# Patient Record
Sex: Female | Born: 1980 | Race: White | Hispanic: No | Marital: Married | Smoking: Never smoker
Health system: Southern US, Community
[De-identification: ages and names within clinical notes are randomized; demographics above are authoritative.]

## PROBLEM LIST (undated history)

## (undated) DIAGNOSIS — E611 Iron deficiency: Secondary | ICD-10-CM

## (undated) DIAGNOSIS — D649 Anemia, unspecified: Secondary | ICD-10-CM

## (undated) HISTORY — DX: Anemia, unspecified: D64.9

## (undated) HISTORY — DX: Iron deficiency: E61.1

---

## 2017-06-13 NOTE — Progress Notes (Signed)
[facility]  Telephone:([phone] [phone]) [phone]  Clinic New consult Note   Patient Care Team: Omer Jackouillard, Jennifer, PA-C as PCP - General (Physician Assistant) 06/16/2017   REFERRAL PHYSICIAN: Charlies Silversouillard, Jennifer, PA-C   CHIEF COMPLAINTS/PURPOSE OF CONSULTATION: Elevated Ferritin level   HISTORY OF PRESENTING ILLNESS:  Bethany Brown 37 y.o. female is here because of an elevated ferritin level. She was [facility]. She was seen on 05/22/17 with complaints of dysuria and heavy menstrual bleeding. Her dysuria was frequent and associated with urgency and hematuria. No fever, discharge, flank pain or vomiting. A UA, UC, CBC and CMP were ordered. Pt's urine tested positive for nitrates and negative for pregnancy. Her iron profile revealed that her Ferritin level was high at 346 and her saturation % was 51. She was diagnosed with a UTI and prescribed Cipro and instructed to take it for 10 days. She was also advised to follow up with GYN for her heavy menstrual bleeding. Her dysuria has resolved after the antibiotics. She notes she has loose stool or diarrhea 4-5 times a day.   Pt has not been taking an iron supplement but has taken one in the past (3 years ago). She states that she was anemic in the past and never had an elevated iron study. She notes that recently (in the past 6-12 months) she bruises easily, has increased fatigue, weight gain of 40-50 lbs, decreased appetite, insomnia and GERD symptoms. She reports she is able to function and complete daily tasks at home. She recently moved 6 months ago and has not started back at work. She notes that she is stressed and takes xanax for sleep every other day alternating with melatonin.   She has a FMHx of CAD, by her father and RA and DM by her mother. No personal or FMHx of blood disorders, CA or  hemochromatosis. Pt does not use tobacco and she drinks wine 4 times weekly. No illicit  drug use. No past surgeries.   On review of systems, pt denies joint pain. Pertinent positives are listed and detailed within the above HPI.    MEDICAL HISTORY:  Past Medical History:  Diagnosis Date  . Anemia   . Iron deficiency     SURGICAL HISTORY: None   SOCIAL HISTORY: Social History   Socioeconomic History  . Marital status: Married    Spouse name: Not on file  . Number of children: Not on file  . Years of education: Not on file  . Highest education level: Not on file  Social Needs  . Financial resource strain: Not on file  . Food insecurity - worry: Not on file  . Food insecurity - inability: Not on file  . Transportation needs - medical: Not on file  . Transportation needs - non-medical: Not on file  Occupational History  . Not on file  Tobacco Use  . Smoking status: Never Smoker  . Smokeless tobacco: Never Used  Substance and Sexual Activity  . Alcohol use: Yes    Alcohol/week: 0.6 - 1.2 oz    Types: 1 - 2 Glasses of wine per week    Comment: 4 days a week   . Drug use: Not on file  . Sexual activity: Not on file  Other Topics Concern  . Not on file  Social History Narrative  . Not on file    FAMILY HISTORY: Family History  Problem Relation Age of Onset  . Heart attack Father  ALLERGIES:  has no allergies on file.  MEDICATIONS:  Current Outpatient Medications  Medication Sig Dispense Refill  . Melatonin 3 MG CAPS Take 1 capsule by mouth at bedtime as needed.    . ALPRAZolam (XANAX) 1 MG tablet TAKE 1 TABLET BY MOUTH 3 TIMES A DAY AS NEEDED FOR SLEEP OR ANXIETY  5   No current facility-administered medications for this visit.     REVIEW OF SYSTEMS:   Constitutional: Denies fevers, chills or abnormal night sweats (+) fatigue, weight gain Eyes: Denies blurriness of vision, double vision or watery eyes Ears, nose, mouth, throat, and face: Denies mucositis or sore throat Respiratory: Denies cough, dyspnea or wheezes Cardiovascular: Denies  chest discomfort or lower extremity swelling Gastrointestinal:  Denies nausea,  (+) heartburn, change in bowel habits (+) diarrhea Skin: Denies abnormal skin rashes Lymphatics: Denies new lymphadenopathy (+) easy bruising Neurological:Denies numbness, tingling or new weaknesses Behavioral/Psych: Mood is stable, no new changes  All other systems were reviewed with the patient and are negative.  PHYSICAL EXAMINATION:  Vitals:   06/16/17 1444  BP: 125/82  Pulse: 98  Resp: 18  Temp: 98.2 F (36.8 C)  SpO2: 98%   Filed Weights   06/16/17 1444  Weight: 183 lb 6.4 oz (83.2 kg)    GENERAL:alert, no distress and comfortable, obese young female  SKIN: skin color, texture, turgor are normal, no rashes or significant lesions EYES: normal, conjunctiva are pink and non-injected, sclera clear OROPHARYNX:no exudate, no erythema and lips, buccal mucosa, and tongue normal  NECK: supple, thyroid normal size, non-tender, without nodularity LYMPH:  no palpable lymphadenopathy in the cervical, axillary or inguinal LUNGS: clear to auscultation and percussion with normal breathing effort HEART: regular rate & rhythm and no murmurs and no lower extremity edema ABDOMEN:abdomen soft, mild tenderness in the RUQ and in the mid abdomen. No rebound pain. No palpable organomegaly. No signs of ascites  Musculoskeletal:no cyanosis of digits and no clubbing  PSYCH: alert & oriented x 3 with fluent speech NEURO: no focal motor/sensory deficits  LABORATORY DATA:  I have reviewed her outside data as listed in H&P   RADIOGRAPHIC STUDIES: I have personally reviewed the radiological images as listed and agreed with the findings in the report. No results found.  ASSESSMENT & PLAN:  37 year old Caucasian female, without significant past medical history, except iron deficient anemia during pregnancy, presented with fatigue, weight gain, insomnia, mild abdominal pain and diarrhea.  Workup by her PCP reviewed  elevated ferritin and transferrin saturation.  1. Elevated Ferritin and transferrin saturation -I reviewed the pt's lab work with her in detail. On 05/22/17, her  Ferritin level was elevated at 346 and her transferrin saturation % was 51. I discussed the causes of an elevated ferritin including hemochromatosis, and infection or chronic inflammation. She had a UTI when that abnormal lab result was taken.  She does not appear to have chronic inflammation such as chronic infection or rheumatological disorder. - I discussed hemochromatosis is a lifelong genetic disease due to HFE gene mutation. Pt has stated that she use to be anemic 3 years ago so I do not think this is the likely cause of her elevated ferritin -I will repeat labs at a fasting condition in the morning on 06/17/17 or 06/18/17. I will call the next day with results. If the test results are still elevated, I will check the hemochromatosis gene mutation. -She will follow up with her PCP   2. Easy bruising  -She has had easy  bruising in the past several months, no significant past medical history of bleeding -Platelet counts normal -PT, APTT, and one Von willebrand panel  3. Fatigue, low appetite, weight gain and mild GI symptoms -not sure the etiology of her multiple complaints, I suggested her follow-up with her primary care physician, check thyroid function etc. -she has been on xanax for anxiety, she denies depression.  PLAN: -Lab on 1/15 or 1/16, including CBC, reticulocyte count, ferritin, iron study, PT, APTT, hemochromatosis gene mutation, and von Willebrand panel  -I will call with results -f/u based on above lab work results     All questions were answered. The patient knows to call the clinic with any problems, questions or concerns. I spent 35 minutes counseling the patient face to face. The total time spent in the appointment was 40 minutes and more than 50% was on counseling.    This document serves as a record of  services personally performed by Malachy Mood, MD. It was created on her behalf by Rosana Fret, a trained medical scribe. The creation of this record is based on the scribe's personal observations and the provider's statements to them.   I have reviewed the above documentation for accuracy and completeness, and I agree with the above.    Malachy Mood, MD 06/16/2017 8:10 PM

## 2017-06-16 ENCOUNTER — Telehealth: Payer: Self-pay | Admitting: Hematology

## 2017-06-16 ENCOUNTER — Encounter: Payer: Self-pay | Admitting: Hematology

## 2017-06-16 ENCOUNTER — Inpatient Hospital Stay: Payer: BLUE CROSS/BLUE SHIELD | Attending: Hematology | Admitting: Hematology

## 2017-06-16 DIAGNOSIS — E611 Iron deficiency: Secondary | ICD-10-CM

## 2017-06-16 DIAGNOSIS — Z79899 Other long term (current) drug therapy: Secondary | ICD-10-CM | POA: Insufficient documentation

## 2017-06-16 DIAGNOSIS — R5383 Other fatigue: Secondary | ICD-10-CM | POA: Insufficient documentation

## 2017-06-16 DIAGNOSIS — G4700 Insomnia, unspecified: Secondary | ICD-10-CM | POA: Diagnosis not present

## 2017-06-16 DIAGNOSIS — N390 Urinary tract infection, site not specified: Secondary | ICD-10-CM

## 2017-06-16 DIAGNOSIS — R630 Anorexia: Secondary | ICD-10-CM | POA: Insufficient documentation

## 2017-06-16 DIAGNOSIS — R197 Diarrhea, unspecified: Secondary | ICD-10-CM | POA: Diagnosis not present

## 2017-06-16 DIAGNOSIS — E119 Type 2 diabetes mellitus without complications: Secondary | ICD-10-CM | POA: Diagnosis not present

## 2017-06-16 DIAGNOSIS — K219 Gastro-esophageal reflux disease without esophagitis: Secondary | ICD-10-CM | POA: Diagnosis not present

## 2017-06-16 DIAGNOSIS — R109 Unspecified abdominal pain: Secondary | ICD-10-CM | POA: Diagnosis not present

## 2017-06-16 DIAGNOSIS — D649 Anemia, unspecified: Secondary | ICD-10-CM | POA: Insufficient documentation

## 2017-06-16 DIAGNOSIS — R7989 Other specified abnormal findings of blood chemistry: Secondary | ICD-10-CM | POA: Diagnosis present

## 2017-06-16 DIAGNOSIS — R233 Spontaneous ecchymoses: Secondary | ICD-10-CM

## 2017-06-16 DIAGNOSIS — R238 Other skin changes: Secondary | ICD-10-CM

## 2017-06-16 DIAGNOSIS — D509 Iron deficiency anemia, unspecified: Secondary | ICD-10-CM

## 2017-06-16 NOTE — Telephone Encounter (Signed)
Gave avs and calendar for January  °

## 2017-06-18 ENCOUNTER — Other Ambulatory Visit: Payer: BLUE CROSS/BLUE SHIELD

## 2017-06-20 ENCOUNTER — Telehealth: Payer: Self-pay | Admitting: *Deleted

## 2017-06-20 ENCOUNTER — Inpatient Hospital Stay: Payer: BLUE CROSS/BLUE SHIELD

## 2017-06-20 DIAGNOSIS — R7989 Other specified abnormal findings of blood chemistry: Secondary | ICD-10-CM | POA: Diagnosis not present

## 2017-06-20 DIAGNOSIS — R233 Spontaneous ecchymoses: Secondary | ICD-10-CM

## 2017-06-20 DIAGNOSIS — R238 Other skin changes: Secondary | ICD-10-CM

## 2017-06-20 LAB — IRON AND TIBC
IRON: 261 ug/dL — AB (ref 41–142)
SATURATION RATIOS: 90 % — AB (ref 21–57)
TIBC: 289 ug/dL (ref 236–444)
UIBC: 28 ug/dL

## 2017-06-20 LAB — CBC WITH DIFFERENTIAL (CANCER CENTER ONLY)
BASOS PCT: 1 %
Basophils Absolute: 0 10*3/uL (ref 0.0–0.1)
EOS ABS: 0.2 10*3/uL (ref 0.0–0.5)
EOS PCT: 4 %
HCT: 42.6 % (ref 34.8–46.6)
Hemoglobin: 14.9 g/dL (ref 11.6–15.9)
LYMPHS ABS: 1.3 10*3/uL (ref 0.9–3.3)
Lymphocytes Relative: 25 %
MCH: 34.3 pg — AB (ref 25.1–34.0)
MCHC: 35 g/dL (ref 31.5–36.0)
MCV: 97.9 fL (ref 79.5–101.0)
Monocytes Absolute: 0.4 10*3/uL (ref 0.1–0.9)
Monocytes Relative: 7 %
Neutro Abs: 3.4 10*3/uL (ref 1.5–6.5)
Neutrophils Relative %: 63 %
PLATELETS: 212 10*3/uL (ref 145–400)
RBC: 4.35 MIL/uL (ref 3.70–5.45)
RDW: 12.6 % (ref 11.2–16.1)
WBC: 5.3 10*3/uL (ref 3.9–10.3)

## 2017-06-20 LAB — APTT: APTT: 30 s (ref 24–36)

## 2017-06-20 LAB — FERRITIN: Ferritin: 468 ng/mL — ABNORMAL HIGH (ref 9–269)

## 2017-06-20 LAB — RETICULOCYTES
RBC.: 4.35 MIL/uL (ref 3.70–5.45)
RETIC COUNT ABSOLUTE: 56.6 10*3/uL (ref 33.7–90.7)
Retic Ct Pct: 1.3 % (ref 0.7–2.1)

## 2017-06-20 LAB — PROTIME-INR
INR: 1
Prothrombin Time: 13.1 seconds (ref 11.4–15.2)

## 2017-06-20 NOTE — Telephone Encounter (Signed)
Called pt & informed of elevated ferritin/iron per Dr Latanya MaudlinFeng's request but waiting on gene mutation test that will give us more information. Informed that it may take up to 2 wks to get results back.  She was very anxious & had lots of questions & asking why ferritin is elevated.  She had me call results to her husband & she set up a 3 way call so that I could tell him the same info.  She expressed understanding.

## 2017-06-20 NOTE — Telephone Encounter (Signed)
-----   Message from Malachy MoodYan Feng, MD sent at 06/20/2017  2:50 PM EST ----- Please call her today and let her know that her iron level is still high, and her gene mutation test result is pending, will take up to 2 weeks, will call her when result is back. She is anxious to know the result.   Thanks  Malachy MoodYan Feng  06/20/2017

## 2017-06-25 LAB — VON WILLEBRAND PANEL
Coagulation Factor VIII: 99 % (ref 57–163)
Ristocetin Co-factor, Plasma: 98 % (ref 50–200)
Von Willebrand Antigen, Plasma: 97 % (ref 50–200)

## 2017-06-25 LAB — HEMOCHROMATOSIS DNA-PCR(C282Y,H63D)

## 2017-06-25 LAB — COAG STUDIES INTERP REPORT

## 2017-07-04 ENCOUNTER — Telehealth: Payer: Self-pay | Admitting: Hematology

## 2017-07-04 NOTE — Telephone Encounter (Signed)
I called patient to discuss her lab results. 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 Since she has elevated iron level, I will schedule her f/u appointment in 1-2 weeks to discuss further tests such as MRI of liver and treatment. I will send a scheduling message.  Malachy MoodYan Annet Manukyan  07/04/2017

## 2017-07-07 ENCOUNTER — Telehealth: Payer: Self-pay | Admitting: Hematology

## 2017-07-07 NOTE — Telephone Encounter (Signed)
Spoke with patient regarding appointment per sched msg 2/1 from YF

## 2017-07-15 ENCOUNTER — Ambulatory Visit: Payer: BLUE CROSS/BLUE SHIELD | Admitting: Nurse Practitioner

## 2017-07-15 ENCOUNTER — Telehealth: Payer: Self-pay | Admitting: Hematology

## 2017-07-15 ENCOUNTER — Other Ambulatory Visit: Payer: Self-pay | Admitting: Hematology

## 2017-07-15 NOTE — Telephone Encounter (Signed)
I called patient today, and explained her lab results in great details.  I recommend her to have a liver MRI to see if she has evidence of iron overload in her liver.  I will cancel her appointment tomorrow, and rescheduled after her MRI.  Patient is agreeable with the plan.  Malachy MoodYan Xara Paulding  07/15/2017

## 2017-07-16 ENCOUNTER — Ambulatory Visit: Payer: BLUE CROSS/BLUE SHIELD | Admitting: Hematology

## 2017-07-22 ENCOUNTER — Telehealth: Payer: Self-pay | Admitting: *Deleted

## 2017-07-22 ENCOUNTER — Telehealth: Payer: Self-pay | Admitting: Hematology

## 2017-07-22 ENCOUNTER — Ambulatory Visit (HOSPITAL_COMMUNITY)
Admission: RE | Admit: 2017-07-22 | Discharge: 2017-07-22 | Disposition: A | Discharge status: Home / Self Care | Payer: BLUE CROSS/BLUE SHIELD | Source: Ambulatory Visit | Attending: Hematology | Admitting: Hematology

## 2017-07-22 ENCOUNTER — Encounter (HOSPITAL_COMMUNITY): Payer: Self-pay | Admitting: Radiology

## 2017-07-22 DIAGNOSIS — K760 Fatty (change of) liver, not elsewhere classified: Secondary | ICD-10-CM | POA: Insufficient documentation

## 2017-07-22 DIAGNOSIS — K7689 Other specified diseases of liver: Secondary | ICD-10-CM | POA: Diagnosis not present

## 2017-07-22 NOTE — Telephone Encounter (Signed)
I called pt back and left a message on her phone: liver MRI shows fatty liver, but no evidence of iron overload. I think phlebotomy is still beneficial for her, and will schedule her f/u and phlebotomy next week. Will discuss more when I see her next week.   Malachy MoodYan Jovonta Levit MD

## 2017-07-22 NOTE — Telephone Encounter (Signed)
Pt in lobby, asking to speak to Dr. Mosetta PuttFeng regarding results of her MRI done earlier this am.  Spoke with patient and informed her Dr. Mosetta PuttFeng was not in the office today. Pt asking for results of her MRI. Advised pt that the results were not available yet and that Dr. Mosetta PuttFeng would call her with results sometime this week.  Pt currently does not have an appointment set up with Dr. Mosetta PuttFeng.  Pt voiced understanding.

## 2017-07-22 NOTE — Telephone Encounter (Signed)
Left message for patient regarding appointment per 2/19 sch msg

## 2017-07-28 ENCOUNTER — Telehealth: Payer: Self-pay

## 2017-07-28 ENCOUNTER — Encounter: Payer: Self-pay | Admitting: *Deleted

## 2017-07-28 ENCOUNTER — Telehealth: Payer: Self-pay | Admitting: *Deleted

## 2017-07-28 NOTE — Telephone Encounter (Signed)
1450-Patient called and was very worried about her phlebotomy appointment.  She is new to the area and wanted to know appointment time and what to expect.  I informed her that she would be here for about 1.5-2 hours total and she would get an IV and they would draw back her blood.  She is worrried since she has never had this done before.  I also informed her that she could have one visitor with her and no children with her due to our flu policy right now.  Although she is worried, she understands and will be here tomorrow. Lorayne MarekMelanie M Rodgers RN, BSN, CPN

## 2017-07-28 NOTE — Telephone Encounter (Signed)
Received vm call from pt asking for return call to give her information regarding what to expect tomorrow with phlebotomy.  She wants to know if she can drive & how long will it take.   Call returned but line busy x 1 Pt called back & discussed what to expect.  Informed to drink plenty of fluids before & after phlebotomy & to eat well before coming.  She is concerned about her son since her husband is out of town & may not get back in time to pick him up. She request a letter to her son's school ([employer]) allowing him to ride the school bus 75 to neighbor's home.  ([place])  Tomorrow.  Fax # [phone].  Informed that this would be sent.

## 2017-07-29 ENCOUNTER — Encounter: Payer: Self-pay | Admitting: Nurse Practitioner

## 2017-07-29 ENCOUNTER — Ambulatory Visit: Payer: BLUE CROSS/BLUE SHIELD

## 2017-07-29 ENCOUNTER — Inpatient Hospital Stay: Payer: BLUE CROSS/BLUE SHIELD | Attending: Hematology | Admitting: Nurse Practitioner

## 2017-07-29 ENCOUNTER — Ambulatory Visit (HOSPITAL_BASED_OUTPATIENT_CLINIC_OR_DEPARTMENT_OTHER): Payer: BLUE CROSS/BLUE SHIELD | Admitting: Medical

## 2017-07-29 VITALS — BP 122/99 | HR 99 | Temp 98.3°F | Resp 18 | Ht 62.5 in | Wt 187.9 lb

## 2017-07-29 DIAGNOSIS — R630 Anorexia: Secondary | ICD-10-CM | POA: Diagnosis not present

## 2017-07-29 DIAGNOSIS — R7989 Other specified abnormal findings of blood chemistry: Secondary | ICD-10-CM

## 2017-07-29 DIAGNOSIS — R5383 Other fatigue: Secondary | ICD-10-CM | POA: Diagnosis not present

## 2017-07-29 DIAGNOSIS — F419 Anxiety disorder, unspecified: Secondary | ICD-10-CM | POA: Diagnosis not present

## 2017-07-29 DIAGNOSIS — R110 Nausea: Secondary | ICD-10-CM

## 2017-07-29 DIAGNOSIS — R55 Syncope and collapse: Secondary | ICD-10-CM

## 2017-07-29 MED ORDER — PROMETHAZINE HCL 25 MG PO TABS: 25.0000 mg | ORAL_TABLET | Freq: Three times a day (TID) | ORAL | 0 refills | Status: AC | PRN

## 2017-07-29 MED ORDER — IBUPROFEN 200 MG PO TABS
ORAL_TABLET | ORAL | Status: AC
  Filled 2017-07-29: qty 2

## 2017-07-29 MED ORDER — IBUPROFEN 200 MG PO TABS
400.0000 mg | ORAL_TABLET | Freq: Once | ORAL | Status: AC
  Administered 2017-07-29: 400 mg via ORAL

## 2017-07-29 MED ORDER — FAMOTIDINE IN NACL 20-0.9 MG/50ML-% IV SOLN
20.0000 mg | INTRAVENOUS | Status: DC
  Administered 2017-07-29: 20 mg via INTRAVENOUS

## 2017-07-29 MED ORDER — ONDANSETRON HCL 4 MG/2ML IJ SOLN
8.0000 mg | INTRAMUSCULAR | Status: AC
  Administered 2017-07-29: 8 mg via INTRAVENOUS

## 2017-07-29 MED ORDER — SODIUM CHLORIDE 0.9 % IV SOLN
INTRAVENOUS | Status: DC
Start: 2017-07-29 — End: 2017-07-29
  Administered 2017-07-29: 12:00:00 via INTRAVENOUS

## 2017-07-29 MED ORDER — SODIUM CHLORIDE 0.9 % IV SOLN
20.0000 mg | Freq: Once | INTRAVENOUS | Status: AC
  Filled 2017-07-29: qty 2

## 2017-07-29 MED ORDER — ONDANSETRON HCL 4 MG/2ML IJ SOLN
INTRAMUSCULAR | Status: AC
  Filled 2017-07-29: qty 4

## 2017-07-29 NOTE — Patient Instructions (Signed)

## 2017-07-29 NOTE — Progress Notes (Addendum)
[facility]  Telephone:(336) [phone] Fax:(336) [phone]  Clinic Follow up Note   Patient Care Team: Omer Jack as PCP - General (Physician Assistant) 07/29/2017  CHIEF COMPLAINT: Elevated ferritin and transferrin saturation  CURRENT THERAPY: Therapeutic phlebotomy as needed  INTERVAL HISTORY: Ms. Bethany Brown returns for follow-up as scheduled prior to first phlebotomy.  She is extremely anxious today, she got no sleep last night despite taking Xanax and melatonin due to stress and anxiety.  She continues to report fatigue and low energy.  She has not been able to eat today due to her nerves, reporting a headache.  She has periodic diarrhea, increased today which she feels is related to anxiety. She notes unintentional weight gain.  Skin is occasionally warm and flushed without fever or chills.   REVIEW OF SYSTEMS:   Constitutional: Denies fevers, chills or abnormal weight loss (+) fatigue (+) unintentional weight gain  Eyes: Denies blurriness of vision Ears, nose, mouth, throat, and face: Denies mucositis or sore throat Respiratory: Denies cough, dyspnea or wheezes Cardiovascular: Denies palpitation, chest discomfort or lower extremity swelling Gastrointestinal:  Denies vomiting, constipation, heartburn or change in bowel habits (+) nausea (+) periodic diarrhea, increased today due to anxiety  Skin: Denies abnormal skin rashes (+) occasionally warm and flushed  Lymphatics: Denies new lymphadenopathy (+) easy bruising Neurological:Denies numbness, tingling or new weaknesses (+) headache Behavioral/Psych: Mood is stable, no new changes (+) anxiety (+) tearful  All other systems were reviewed with the patient and are negative.  MEDICAL HISTORY:  Past Medical History:  Diagnosis Date  . Anemia   . Iron deficiency     SURGICAL HISTORY: No past surgical history on file.  I have reviewed the social history and family history with the patient and they are  unchanged from previous note.  ALLERGIES:  has no allergies on file.  MEDICATIONS:  Current Outpatient Medications  Medication Sig Dispense Refill  . ALPRAZolam (XANAX) 1 MG tablet TAKE 1 TABLET BY MOUTH 3 TIMES A DAY AS NEEDED FOR SLEEP OR ANXIETY  5  . Melatonin 3 MG CAPS Take 1 capsule by mouth at bedtime as needed.     No current facility-administered medications for this visit.     PHYSICAL EXAMINATION: ECOG PERFORMANCE STATUS: 1 - Symptomatic but completely ambulatory  Vitals:   07/29/17 1006  BP: (!) 122/99  Pulse: 99  Resp: 18  Temp: 98.3 F (36.8 C)  SpO2: 98%   Filed Weights   07/29/17 1006  Weight: 187 lb 14.4 oz (85.2 kg)    GENERAL:alert, no distress and comfortable SKIN: skin color, texture, turgor are normal, no rashes or significant lesions EYES: normal, Conjunctiva are pink and non-injected, sclera clear OROPHARYNX:no exudate, no erythema and lips, buccal mucosa, and tongue normal  NECK: supple, thyroid normal size, non-tender, without nodularity LYMPH:  no palpable cervical or supraclavicular lymphadenopathy  LUNGS: clear to auscultation bilaterally with normal breathing effort HEART: regular rate & rhythm and no murmurs and no lower extremity edema ABDOMEN:abdomen soft, non-tender and normal bowel sounds. No hepatomegaly  Musculoskeletal:no cyanosis of digits and no clubbing  NEURO: alert & oriented x 3 with fluent speech, no focal motor/sensory deficits (+) Anxious  LABORATORY DATA:  I have reviewed the data as listed CBC Latest Ref Rng & Units 06/20/2017  WBC 3.9 - 10.3 K/uL 5.3  Hematocrit 34.8 - 46.6 % 42.6  Platelets 145 - 400 K/uL 212    RADIOGRAPHIC STUDIES: I have personally reviewed the radiological images as  listed and agreed with the findings in the report. No results found.   MRI ABDOMEN WO CONTRAST IMPRESSION: 07/22/17 1. Hepatic steatosis. No findings to suggest iron deposition within the liver. 2. Small liver  cysts.  ASSESSMENT & PLAN: 37 year old Caucasian female, without significant past medical history, except iron deficient anemia during pregnancy, presented with fatigue, weight gain, insomnia, mild abdominal pain and diarrhea.  Workup by her PCP reviewed elevated ferritin and transferrin saturation.  1. 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 MR abdomen showed hepatic steatosis but no findings to suggest iron deposition within the liver. Dr. Mosetta PuttFeng has reviewed her labs and findings in great detail. She recommends therapeutic phlebotomy today and in the future as needed. Will recheck fasting labs in 2 months and determine plan after those results. F/u with Dr. Mosetta PuttFeng in 4 months.  2. Easy bruising Platelets, PT/APTT, and von willebrand panel are are normal.  3. Fatigue, low appetite, weight gain, mild GI symptoms -I encouraged her to eat well rounded diet and be active. Her PCP is moving out of state but there is an interim provider covering at the practice. I encouraged her to f/u with PCP for these symptoms. During today's visit she requested anti-emetic, I prescribed phenergan to her pharmacy.  4. Anxiety She is very anxious about phlebotomy today. I explained the procedure and monitoring in detail. Her husband is out of town for work, an 35 year old son at home, and is buying a house out of state, all contributing to her stress. Takes xanax as needed.   PLAN: -Therapeutic phlebotomy today -Fasting labs in 2 months -Lab, f/u with Dr. Mosetta PuttFeng in 4 months -Prescription for phenergan   All questions were answered. The patient knows to call the clinic with any problems, questions or concerns. No barriers to learning was detected.     [name], NP 07/29/17

## 2017-07-29 NOTE — Addendum Note (Signed)
Addended by: Pollyann SamplesBURTON, LACIE K on: 07/29/2017 02:21 PM   Modules accepted: Orders

## 2017-07-29 NOTE — Progress Notes (Signed)
Bethany PiqueChristie Brown presents today for phlebotomy per MD orders. Phlebotomy procedure started at 1135 and ended at 1201. 18G needle in Left AC used to remove 478 grams. Needle removed, intact.   1205 Patient stated she was getting very hot, sweaty and nauseated. Vitals taken 65/39  Zenaida NieceVan, [place] called  2L saline started at 1214 vs 97/79 Pepcid at 1216 2L O2 started Zofran 1220 96/69  New iv started in left hand. See flowsheet  Patient will continue to be monitored.  Patient tolerated procedure well. IV needle removed intact.  Patient observed for about 2 hours post phlebotomy. VS stable, patient reports she is feeling better.

## 2017-07-31 NOTE — Progress Notes (Signed)
[facility] Progress Note   Bethany PiqueChristie Merritts [id number] 10/17/1980 37 y.o.  Bethany Brown is managed by Dr. Vicki MalletYan Feng  Jakeria Ney presents for a therapeutic phlebotomy.   Assessment: Plan:    Vagal reaction  Bethany Brown was seen in the infusion room for a suspected vagal reaction. She completed a therapeutic phlebotomy prior to her reaction. Her symptoms included: Weakness, hypotension, and nausea. Bethany Brown was given normal saline IV, Zofran IV, Pepcid IV, and supplemental oxygenafter onset of her symptoms. Bethany Brown did not completely respond to intervention.  She had to receive a second liter of normal saline and call for family/friends to pick her up.   Please see After Visit Summary for patient specific instructions.  Future Appointments  Date Time Provider Department Center  09/26/2017  8:00 AM [facility] ONLY [facility] None  11/26/2017  9:45 AM [facility] LAB 6 [facility] None  11/26/2017 10:15 AM Malachy MoodFeng, Yan, MD [facility] None    No orders of the defined types were placed in this encounter.      Subjective:   Patient ID:  Bethany PiqueChristie Krigbaum is a 37 y.o. (DOB 12/17/1980) female.  Chief Complaint: No chief complaint on file.   HPI Bethany Brown was seen in the infusion room for a suspected vagal reaction. She completed a therapeutic phlebotomy prior to her reaction. Her symptoms included: Weakness, hypotension, and nausea.  Bethany Brown was given normal saline IV, Zofran IV, Pepcid IV, and supplemental oxygen after onset of her symptoms. Bethany Brown did not completely respond to intervention.  She had to receive a second liter of normal saline and call for family/friends to pick her up.   Medications: I have reviewed the patient's current medications.  Allergies: Not on File  Past Medical History:  Diagnosis Date  . Anemia   . Iron deficiency     No past surgical history on file.  Family History  Problem  Relation Age of Onset  . Heart attack Father     Social History   Socioeconomic History  . Marital status: Married    Spouse name: Not on file  . Number of children: Not on file  . Years of education: Not on file  . Highest education level: Not on file  Social Needs  . Financial resource strain: Not on file  . Food insecurity - worry: Not on file  . Food insecurity - inability: Not on file  . Transportation needs - medical: Not on file  . Transportation needs - non-medical: Not on file  Occupational History  . Not on file  Tobacco Use  . Smoking status: Never Smoker  . Smokeless tobacco: Never Used  Substance and Sexual Activity  . Alcohol use: Yes    Alcohol/week: 0.6 - 1.2 oz    Types: 1 - 2 Glasses of wine per week    Comment: 4 days a week   . Drug use: Not on file  . Sexual activity: Not on file  Other Topics Concern  . Not on file  Social History Narrative  . Not on file    Past Medical History, Surgical history, Social history, and Family history were reviewed and updated as appropriate.   Please see review of systems for further details on the patient's review from today.   Review of Systems:  Review of Systems  HENT: Negative for trouble swallowing.   Respiratory: Negative for cough, choking, shortness of breath and wheezing.   Gastrointestinal: Positive for nausea. Negative for vomiting.  Neurological: Positive for weakness. Negative for headaches.    Objective:   Physical Exam:  There were no vitals taken for this visit.  Physical Exam  Constitutional: No distress.  HENT:  Head: Normocephalic and atraumatic.  Cardiovascular: Normal rate, regular rhythm and normal heart sounds. Exam reveals no gallop and no friction rub.  No murmur heard. Pulmonary/Chest: Effort normal and breath sounds normal. No respiratory distress. She has no wheezes. She has no rales.  Neurological: She is alert.  Skin: Skin is warm and dry. No rash noted. She is not  diaphoretic. No erythema.    Lab Review:  No results found for: NA, K, CL, CO2, GLUCOSE, BUN, CREATININE, CALCIUM, PROT, ALBUMIN, AST, ALT, ALKPHOS, BILITOT, GFRNONAA, GFRAA     Component Value Date/Time   WBC 5.3 06/20/2017 1115   RBC 4.35 06/20/2017 1115   RBC 4.35 06/20/2017 1115   HCT 42.6 06/20/2017 1115   PLT 212 06/20/2017 1115   MCV 97.9 06/20/2017 1115   MCH 34.3 (H) 06/20/2017 1115   MCHC 35.0 06/20/2017 1115   RDW 12.6 06/20/2017 1115   LYMPHSABS 1.3 06/20/2017 1115   MONOABS 0.4 06/20/2017 1115   EOSABS 0.2 06/20/2017 1115   BASOSABS 0.0 06/20/2017 1115   -------------------------------  Imaging from last 24 hours (if applicable):  Radiology interpretation: Mr Abdomen Wo Contrast  Result Date: 07/22/2017 CLINICAL DATA:  Evaluate for possible hemochromatosis. EXAM: MRI ABDOMEN WITHOUT CONTRAST TECHNIQUE: Multiplanar multisequence MR imaging was performed without the administration of intravenous contrast. COMPARISON:  None. FINDINGS: Lower chest: No acute findings. Hepatobiliary: Between the inphase and out of phase sequences there is loss of signal within the liver compatible with hepatic steatosis. There is a small T2 hyperintense structure in the posterior right lobe of liver measuring 5 mm. No additional focal liver abnormalities. The gallbladder appears normal. No biliary dilatation. Pancreas: Normal appearance of the pancreas. No mass, inflammation or main duct dilatation. Spleen:  Normal in size and appearance. Adrenals/Urinary Tract:  The adrenal glands appear normal. The kidneys are unremarkable.  No mass or hydronephrosis identified. Stomach/Bowel: Visualized portions within the abdomen are unremarkable. Vascular/Lymphatic: No pathologically enlarged lymph nodes identified. No abdominal aortic aneurysm demonstrated. Other:  No free fluid or fluid collections. Musculoskeletal: No abnormal signal from within the bone marrow. IMPRESSION: 1. Hepatic steatosis. No  findings to suggest iron deposition within the liver. 2. Small liver cysts. Electronically Signed   By: Signa Kell M.D.   On: 07/22/2017 10:04

## 2017-08-06 ENCOUNTER — Encounter: Payer: Self-pay | Admitting: *Deleted

## 2017-08-06 NOTE — Progress Notes (Signed)
[facility] Clinical Social Work  Visual merchandiserClinical Social Worker received referral form Systems developermedical oncologist for emotional support and assessment of psychosocial needs.  CSW contacted patient at home to assess for needs.  CSW left patient a message offering support and information on [facility].  CSW provided contact information and encouraged patient to call with questions or concerns.   Tamala JulianAbigail Aliveah Gallant, MSW, LCSW, OSW-C Clinical Social Worker [facility] [phone]

## 2017-09-24 ENCOUNTER — Other Ambulatory Visit: Payer: Self-pay | Admitting: Hematology

## 2017-09-24 DIAGNOSIS — R7989 Other specified abnormal findings of blood chemistry: Secondary | ICD-10-CM

## 2017-09-25 ENCOUNTER — Telehealth: Payer: Self-pay

## 2017-09-25 NOTE — Telephone Encounter (Signed)
Patient left message that she had blood work done by her OBGYN including iron levels, wanting to know if she has to have that done again 09/26/17.  Left voice message for patient need to know how recent that was and if her provider could send us a copy of the results.

## 2017-09-26 ENCOUNTER — Inpatient Hospital Stay: Payer: BLUE CROSS/BLUE SHIELD | Attending: Hematology

## 2017-09-29 ENCOUNTER — Telehealth: Payer: Self-pay

## 2017-09-29 NOTE — Telephone Encounter (Signed)
Patient calls stating she had recent labs at her OB/GYN's office, having hard copy faxed to [place].  Patient feeling very fatigued at this time.  Labs placed on Dr. Latanya Maudlin desk for review.

## 2017-09-30 ENCOUNTER — Telehealth: Payer: Self-pay

## 2017-09-30 NOTE — Telephone Encounter (Signed)
Called to verify appointment date and time. Per 4/30 phone message return call

## 2017-10-02 ENCOUNTER — Telehealth: Payer: Self-pay

## 2017-10-02 ENCOUNTER — Inpatient Hospital Stay: Payer: BLUE CROSS/BLUE SHIELD | Attending: Hematology

## 2017-10-02 ENCOUNTER — Other Ambulatory Visit: Payer: Self-pay

## 2017-10-02 ENCOUNTER — Telehealth: Payer: Self-pay | Admitting: *Deleted

## 2017-10-02 VITALS — BP 110/74 | HR 70 | Temp 98.0°F | Resp 18

## 2017-10-02 DIAGNOSIS — R7989 Other specified abnormal findings of blood chemistry: Secondary | ICD-10-CM | POA: Diagnosis not present

## 2017-10-02 DIAGNOSIS — R110 Nausea: Secondary | ICD-10-CM

## 2017-10-02 MED ORDER — LORAZEPAM 1 MG PO TABS
1.0000 mg | ORAL_TABLET | Freq: Once | ORAL | Status: AC
  Administered 2017-10-02: 1 mg via SUBLINGUAL

## 2017-10-02 MED ORDER — LORAZEPAM 1 MG PO TABS
ORAL_TABLET | ORAL | Status: AC
  Filled 2017-10-02: qty 1

## 2017-10-02 NOTE — Telephone Encounter (Signed)
Patient thinks that her appointment

## 2017-10-02 NOTE — Telephone Encounter (Signed)
Infusion nurse called stating that patient's next appointment is not until June.    Patient thinks she should be seen sooner sometime this month.

## 2017-10-02 NOTE — Patient Instructions (Signed)

## 2017-10-02 NOTE — Progress Notes (Signed)
Bethany Brown presents today for phlebotomy per MD orders. 18g to the left [facility] Phlebotomy procedure started at 1000 and ended at 1015 due to side effects.  222 cc removed when phlebotomy was stopped. Patient experiencing "floating head" dizzy and feeling like she was going to pass out again. Vital signs taken see flowsheet. MD notified. Patient states she became very nauseated. Order received for Ativan  sublingual.   Dr. Latanya Maudlin nurse notified about error in schedule per patient. Labs/OV should be scheduled for next week. Elnita Maxwell, RN advised she would call patient with appt.  IV needle removed intact

## 2017-10-02 NOTE — Telephone Encounter (Signed)
Patient calls with complain of shortness of breath and chest discomfort this afternoon after phlebotomy.   Having nausea as well wants something called into her pharmacy as she had thrown away her Phenergan.  Advised patient to be evaluated at nearest emergency department for SOB and chest discomfort.  Patient verbalized an understanding.

## 2017-10-02 NOTE — Telephone Encounter (Addendum)
Received call from pt stating that she had phlebotomy this am & her heart rate dropped & she felt faint & she was given ativan.  She has been sleeping & woke up feeling like it is hard to breath & she has to sit up to breath.  She states she feels over anxious & nauseaed.  She took xanax @ 1 hour ago & it hasn't helped.  She doesn't have any phenergan at home & states she threw it away.  Message given to Dr Latanya Maudlin desk RN.   Per Dr Mosetta Putt, called Dr Valinda Hoar & left message for her to call Dr Mosetta Putt on Monday to discuss removing pt's IUD.

## 2017-10-03 ENCOUNTER — Telehealth: Payer: Self-pay | Admitting: *Deleted

## 2017-10-03 NOTE — Telephone Encounter (Signed)
Received call from pt stating that she doesn't feel very well & is tired, fatigued, & nauseas. She says that she needs to see Dr Mosetta Putt soon & someone was supposed to be making her an appt but she hasn't heard from anyone. Discussed with DR Mosetta Putt & she will call pt.

## 2017-10-06 ENCOUNTER — Telehealth: Payer: Self-pay | Admitting: Hematology

## 2017-10-06 NOTE — Telephone Encounter (Signed)
Scheduled appt per 5/6 sch msg - left vm for pt re appts.  °

## 2017-10-09 ENCOUNTER — Telehealth: Payer: Self-pay | Admitting: Hematology

## 2017-10-09 ENCOUNTER — Inpatient Hospital Stay: Payer: BLUE CROSS/BLUE SHIELD | Admitting: Hematology

## 2017-10-09 NOTE — Telephone Encounter (Signed)
I returned pt's call today. She cancelled her appointment with me this morning. I spoke with her GYN Dr. Valinda Hoar today. Due to pt's poor tolerance to phlebotomy twice, I strongly recommend her to have hr IUD removed, and let her menstrual period return, which will allow her to loose some blood and iron periodically. Dr. Valinda Hoar feels it may not help much due to her light period. Pt will think about if she wants to remove her IUD after speaking with Dr. Valinda Hoar. She repeated asked me why her ferritin is high and her iron level is normal or low. I explained to her she has no definitive evidence of hemachromatosis, and her elevated ferritin is probably related to chronic inflammation. Pt has been calling my office frequently, very anxious, nervious. I recommend her to see psychiatrist for counseling. I spent a total of 25 mins on the phone. I will schedule her lab and f/u in 2 months. She will see her PCP in a few weeks and will repeat iron study.   Bethany Brown  10/09/2017

## 2017-10-21 ENCOUNTER — Emergency Department (HOSPITAL_COMMUNITY): Payer: BLUE CROSS/BLUE SHIELD

## 2017-10-21 ENCOUNTER — Emergency Department (HOSPITAL_COMMUNITY)
Admission: EM | Admit: 2017-10-21 | Discharge: 2017-10-21 | Disposition: A | Discharge status: Home / Self Care | Payer: BLUE CROSS/BLUE SHIELD | Attending: Emergency Medicine | Admitting: Emergency Medicine

## 2017-10-21 ENCOUNTER — Encounter (HOSPITAL_COMMUNITY): Payer: Self-pay | Admitting: Emergency Medicine

## 2017-10-21 ENCOUNTER — Other Ambulatory Visit: Payer: Self-pay

## 2017-10-21 DIAGNOSIS — Y9301 Activity, walking, marching and hiking: Secondary | ICD-10-CM | POA: Diagnosis not present

## 2017-10-21 DIAGNOSIS — S3992XA Unspecified injury of lower back, initial encounter: Secondary | ICD-10-CM | POA: Diagnosis present

## 2017-10-21 DIAGNOSIS — W108XXA Fall (on) (from) other stairs and steps, initial encounter: Secondary | ICD-10-CM | POA: Insufficient documentation

## 2017-10-21 DIAGNOSIS — Y92009 Unspecified place in unspecified non-institutional (private) residence as the place of occurrence of the external cause: Secondary | ICD-10-CM | POA: Diagnosis not present

## 2017-10-21 DIAGNOSIS — Y999 Unspecified external cause status: Secondary | ICD-10-CM | POA: Insufficient documentation

## 2017-10-21 DIAGNOSIS — S300XXA Contusion of lower back and pelvis, initial encounter: Secondary | ICD-10-CM

## 2017-10-21 LAB — POC URINE PREG, ED: Preg Test, Ur: NEGATIVE

## 2017-10-21 MED ORDER — KETOROLAC TROMETHAMINE 60 MG/2ML IM SOLN
30.0000 mg | Freq: Once | INTRAMUSCULAR | Status: AC
  Administered 2017-10-21: 30 mg via INTRAMUSCULAR
  Filled 2017-10-21: qty 2
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
## 2017-10-21 NOTE — Discharge Instructions (Addendum)
Use ice packs over the area for comfort. Take ibuprofen 600 mg 4 times a day for pain and to prevent scar tissue from forming in your buttock.  Follow up with your doctor in the next 1-2 weeks or sooner if it gets worse.

## 2017-11-26 ENCOUNTER — Inpatient Hospital Stay: Payer: BLUE CROSS/BLUE SHIELD | Admitting: Hematology

## 2017-11-26 ENCOUNTER — Inpatient Hospital Stay: Payer: BLUE CROSS/BLUE SHIELD | Attending: Hematology

## 2018-12-14 IMAGING — MR MR ABDOMEN W/O CM
5 of 9 series · 24 of 48 positions shown · non-contrast
Comparison: None.

CLINICAL DATA: Evaluate for possible hemochromatosis.

EXAM:
MRI ABDOMEN WITHOUT CONTRAST
TECHNIQUE: Multiplanar multisequence MR imaging was performed without the
administration of intravenous contrast.

[Series 1: 3 plane bh · axial · 8.0mm · 0.78mm/px · 1 of 13 slices shown]
[im 1/13]
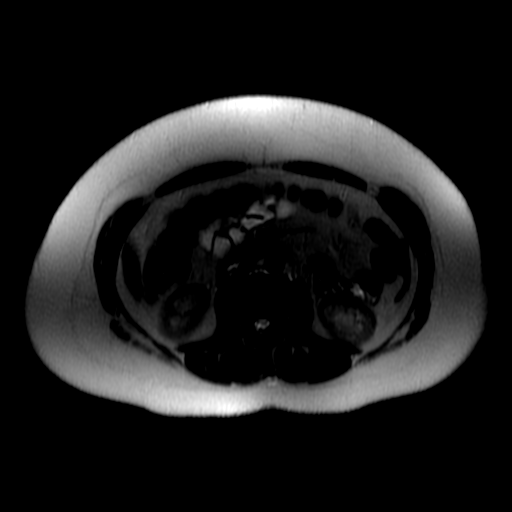

[Series 3: ax dualecho · axial · 5.0mm · 0.78mm/px · z∈[-51,+204]mm · 8 of 104 slices shown]
[im 1/104]
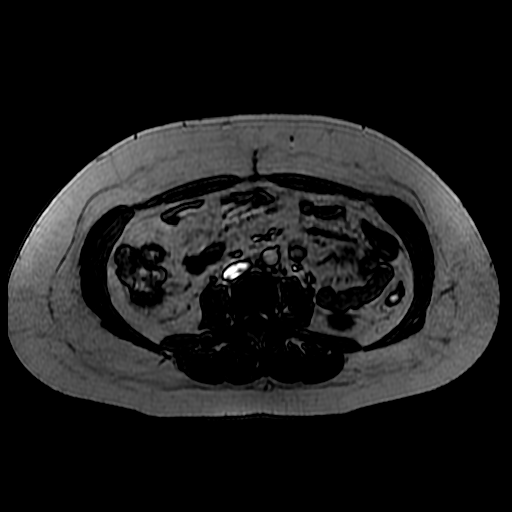
[im 12/104]
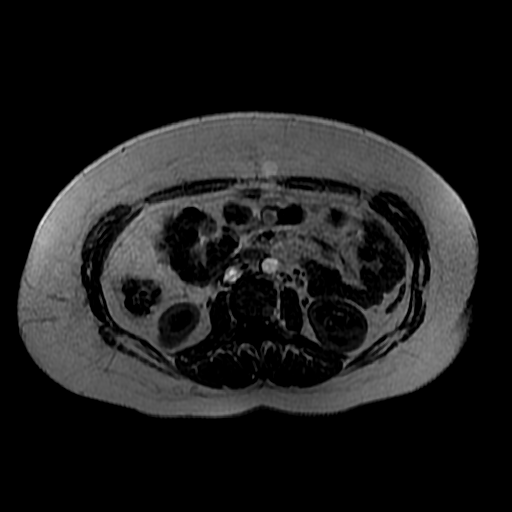
[im 35/104]
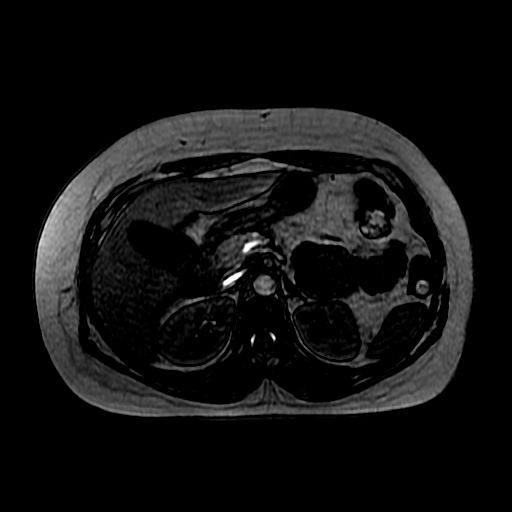
[im 46/104]
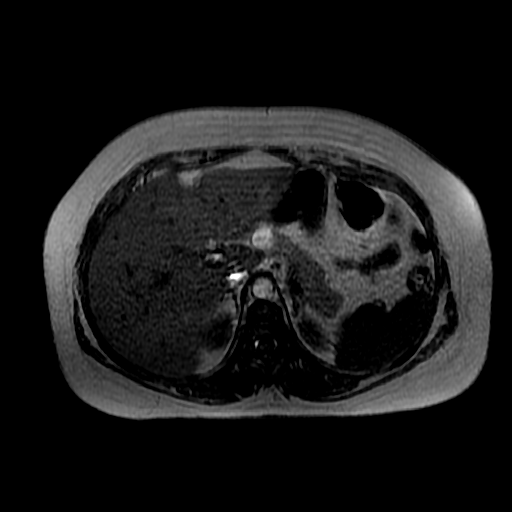
[im 58/104]
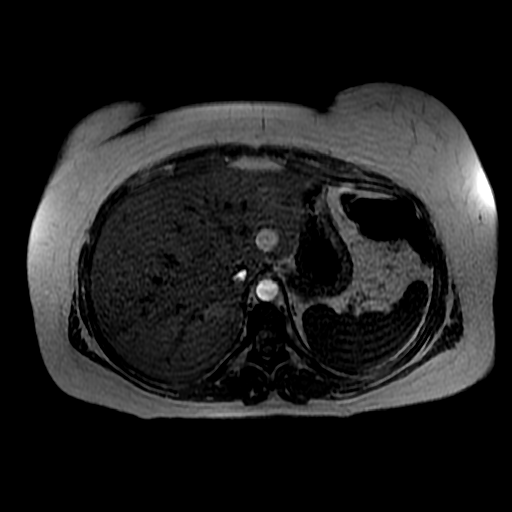
[im 69/104]
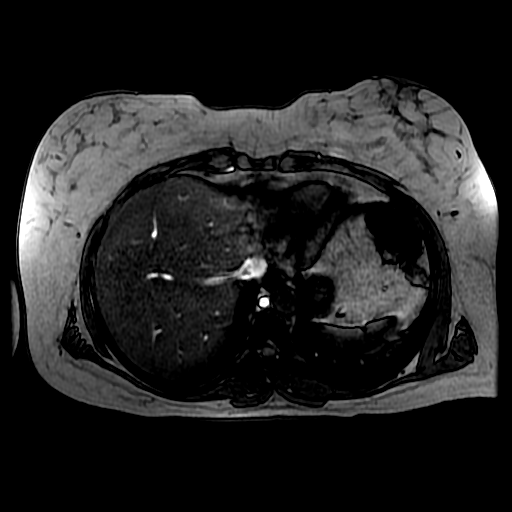
[im 92/104]
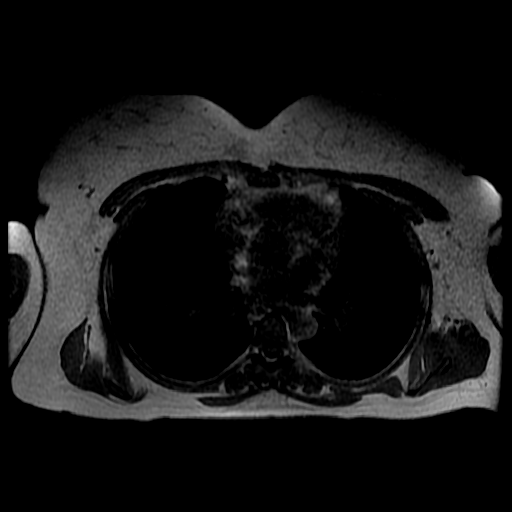
[im 104/104]
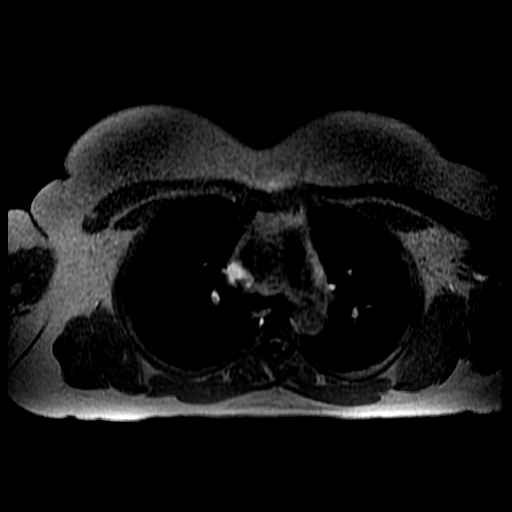

[Series 3: T2 fat-sat · axial · 5.0mm · 0.78mm/px · z∈[-42,+228]mm · 5 of 55 slices shown]
[im 1/55]
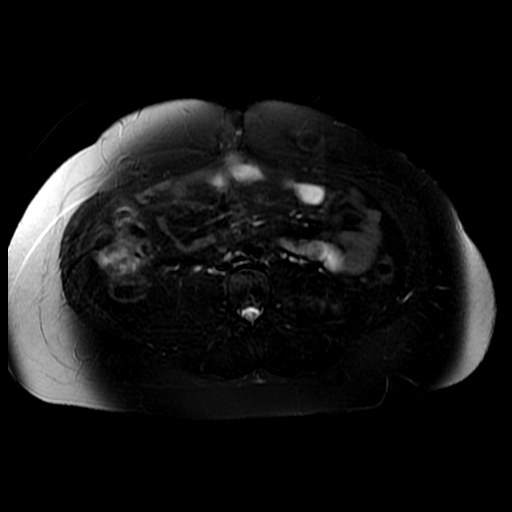
[im 14/55]
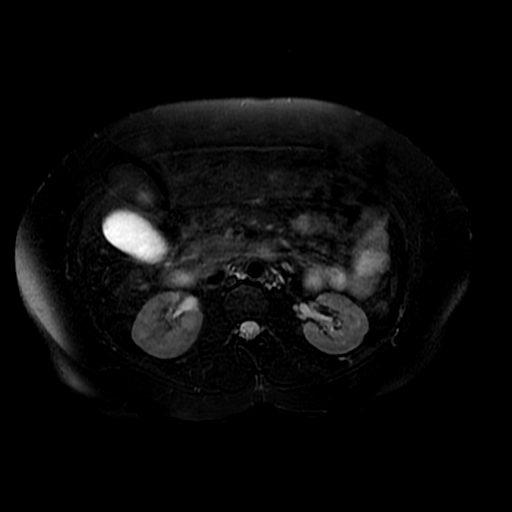
[im 28/55]
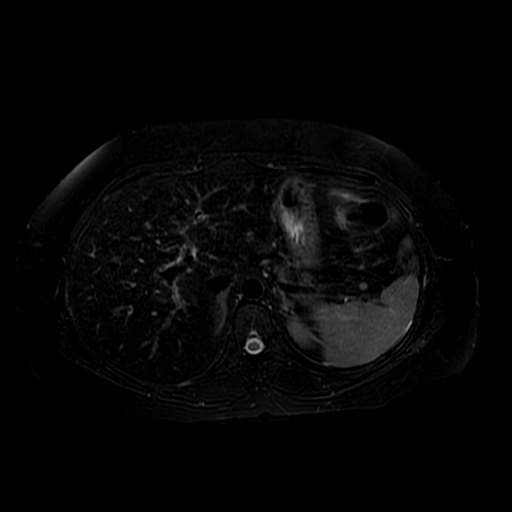
[im 41/55]
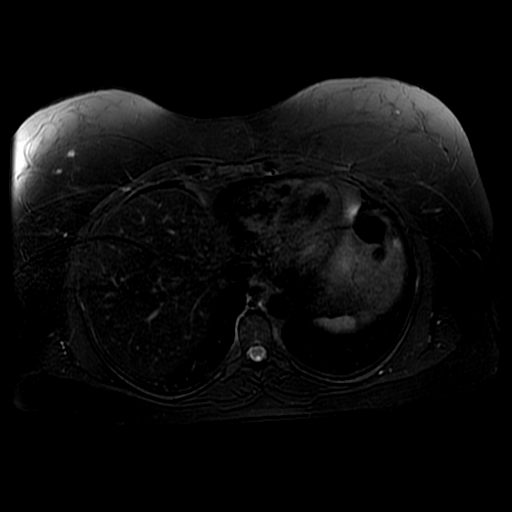
[im 55/55]
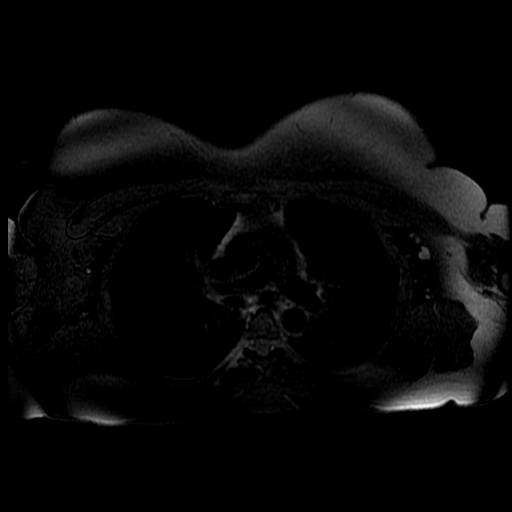

[Series 4: DWI b500 · axial · 6.0mm · 1.76mm/px · z∈[-47,+226]mm · 7 of 72 slices shown]
[im 1/72]
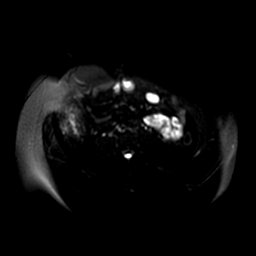
[im 12/72]
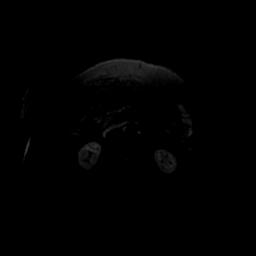
[im 24/72]
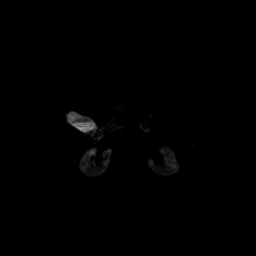
[im 36/72]
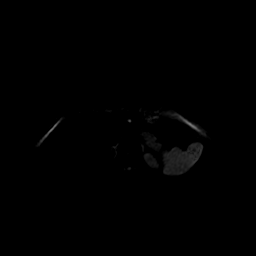
[im 48/72]
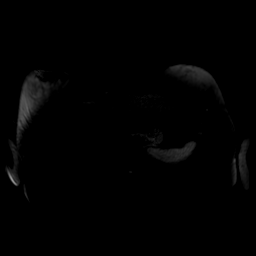
[im 60/72]
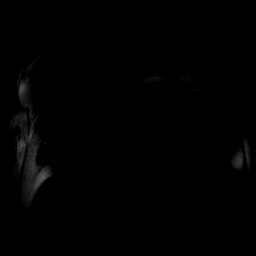
[im 72/72]
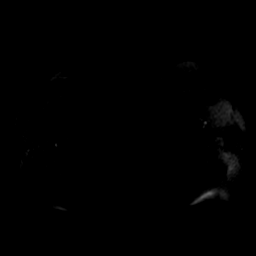

[Series 5: T2 · axial · 5.0mm · 0.78mm/px · z∈[-42,+93]mm · 3 of 55 slices shown]
[im 1/55]
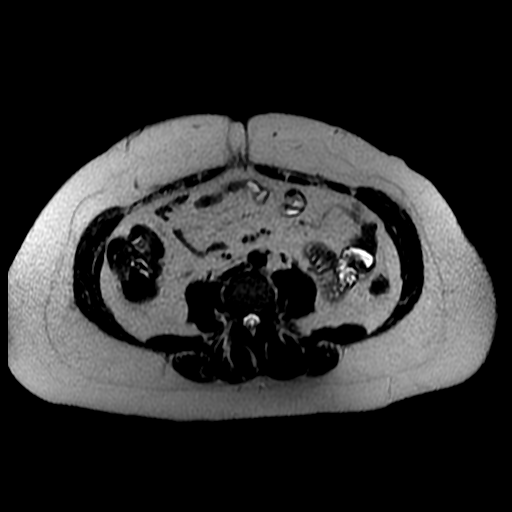
[im 14/55]
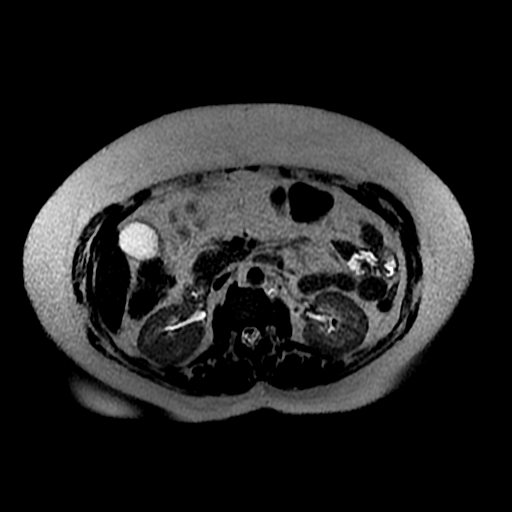
[im 28/55]
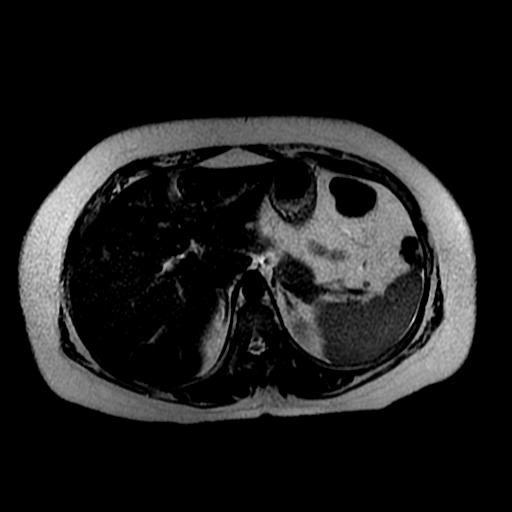

[24 of 48 positions shown; findings below may reference images not displayed]

FINDINGS: Lower chest: No acute findings.

Hepatobiliary: Between the inphase and out of phase sequences there
is loss of signal within the liver compatible with hepatic
steatosis. There is a small T2 hyperintense structure in the
posterior right lobe of liver measuring 5 mm. No additional focal
liver abnormalities. The gallbladder appears normal. No biliary
dilatation.

Pancreas: Normal appearance of the pancreas. No mass, inflammation
or main duct dilatation.

Spleen:  Normal in size and appearance.

Adrenals/Urinary Tract:  The adrenal glands appear normal.

The kidneys are unremarkable.  No mass or hydronephrosis identified.

Stomach/Bowel: Visualized portions within the abdomen are
unremarkable.

Vascular/Lymphatic: No pathologically enlarged lymph nodes
identified. No abdominal aortic aneurysm demonstrated.

Other:  No free fluid or fluid collections.

Musculoskeletal: No abnormal signal from within the bone marrow.
IMPRESSION: 1. Hepatic steatosis. No findings to suggest iron deposition within
the liver.
2. Small liver cysts.
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
